# Patient Record
Sex: Male | Born: 1975 | Race: Black or African American | Hispanic: No | Marital: Married | Smoking: Current every day smoker
Health system: Southern US, Community
[De-identification: ages and names within clinical notes are randomized; demographics above are authoritative.]

## PROBLEM LIST (undated history)

## (undated) DIAGNOSIS — G8929 Other chronic pain: Secondary | ICD-10-CM

## (undated) DIAGNOSIS — M549 Dorsalgia, unspecified: Secondary | ICD-10-CM

## (undated) HISTORY — PX: APPENDECTOMY: SHX54

---

## 1999-12-24 ENCOUNTER — Encounter: Payer: Self-pay | Admitting: Emergency Medicine

## 1999-12-25 ENCOUNTER — Inpatient Hospital Stay (HOSPITAL_COMMUNITY): Admission: EM | Admit: 1999-12-25 | Discharge: 1999-12-26 | Payer: Self-pay | Admitting: Emergency Medicine

## 2003-01-27 ENCOUNTER — Emergency Department (HOSPITAL_COMMUNITY): Admission: EM | Admit: 2003-01-27 | Discharge: 2003-01-27 | Payer: Self-pay | Admitting: Emergency Medicine

## 2006-05-14 ENCOUNTER — Emergency Department (HOSPITAL_COMMUNITY): Admission: EM | Admit: 2006-05-14 | Discharge: 2006-05-14 | Payer: Self-pay | Admitting: Emergency Medicine

## 2011-02-03 ENCOUNTER — Other Ambulatory Visit: Payer: Self-pay | Admitting: Family Medicine

## 2011-02-03 ENCOUNTER — Ambulatory Visit (HOSPITAL_COMMUNITY)
Admission: RE | Admit: 2011-02-03 | Discharge: 2011-02-03 | Disposition: A | Discharge status: Home / Self Care | Payer: BC Managed Care – PPO | Source: Ambulatory Visit | Attending: Family Medicine | Admitting: Family Medicine

## 2011-02-03 DIAGNOSIS — N509 Disorder of male genital organs, unspecified: Secondary | ICD-10-CM | POA: Insufficient documentation

## 2011-02-03 DIAGNOSIS — N5089 Other specified disorders of the male genital organs: Secondary | ICD-10-CM

## 2011-02-03 DIAGNOSIS — N433 Hydrocele, unspecified: Secondary | ICD-10-CM | POA: Insufficient documentation

## 2011-11-23 ENCOUNTER — Ambulatory Visit: Payer: BC Managed Care – PPO

## 2011-11-23 ENCOUNTER — Ambulatory Visit (INDEPENDENT_AMBULATORY_CARE_PROVIDER_SITE_OTHER): Payer: BC Managed Care – PPO | Admitting: Family Medicine

## 2011-11-23 VITALS — BP 123/82 | HR 62 | Temp 98.6°F | Resp 16 | Ht 73.5 in | Wt 173.4 lb

## 2011-11-23 DIAGNOSIS — M79644 Pain in right finger(s): Secondary | ICD-10-CM

## 2011-11-23 DIAGNOSIS — S61409A Unspecified open wound of unspecified hand, initial encounter: Secondary | ICD-10-CM

## 2011-11-23 DIAGNOSIS — M79609 Pain in unspecified limb: Secondary | ICD-10-CM

## 2011-11-23 MED ORDER — CEPHALEXIN 250 MG PO CAPS: 250.0000 mg | ORAL_CAPSULE | Freq: Two times a day (BID) | ORAL | Status: AC

## 2011-11-23 NOTE — Progress Notes (Signed)
Procedure:  VCO  Lidocaine 2% plain, MCH block Sterile field and prep. Wound explored.  Adipose tissue visualized. Wound irrigated with 15 cc sterile NACL Closed loosely with # 3 HM, 5.0 Ethilon Cleansed and dressed.

## 2011-11-23 NOTE — Patient Instructions (Signed)

## 2011-11-23 NOTE — Progress Notes (Signed)
Patient ID: Evan Franklin, male   DOB: 1976-03-31, 36 y.o.   MRN: [id number] Evan Franklin is a 36 y.o. male who presents to Urgent Care today for crush injury to Right 5th digit:  1.  Crush injury:  Patient accidentally dropped large metal crate on 5th digit of Right hand.  Had immediate pain and swelling.  Also some bleeding at that time as well where he suffered laceration at the end of his finger.  Waited about 23 hours before he presented to care.  Pain increased and he had some further bleeding as well as some minor tissue extruding from laceration.     PMH reviewed.    ROS as above otherwise neg Medications reviewed. No current outpatient prescriptions on file.    Exam:  BP 123/82  Pulse 62  Temp 98.6 F (37 C) (Oral)  Resp 16  Ht 6' 1.5" (1.867 m)  Wt 173 lb 6.4 oz (78.654 kg)  BMI 22.57 kg/m2  SpO2 98% Gen: Well NAD HEENT: EOMI,  MMM Ext:  Right 5th digit with 1.5 cm laceration noted at tip of finger.  Full extension and flexion of finger.  Moderate tenderness to palpation.   CV:  Good cap refill in this and other 9 digits.     [facility] reading (PRIMARY) by  Dr. Gwendolyn Grant:  No distal tuft fracture, no displacement.  No cortical fractures. .   Assessment and Plan:  1.  Finger laceration:  No cortical injury/fracture to distal phalanx.  Sutured repair performed by PA.  Only 2 sutures due to delay.  FU in 48 hours for wound recheck and 7 days for suture removal.

## 2012-01-13 IMAGING — US US SCROTUM
1 series · 14 of 25 positions shown · non-contrast
Comparison: None

CLINICAL DATA: Right testicular swelling and pain

SCROTAL ULTRASOUND
DOPPLER ULTRASOUND OF THE TESTICLES
TECHNIQUE: Complete ultrasound examination of the testicles,
epididymis, and other scrotal structures was performed.  Color and
spectral Doppler ultrasound were also utilized to evaluate blood
flow to the testicles.

[Series 1: us scrotum · 0.08mm/px · 14 of 48 slices shown]
[im 1/48]
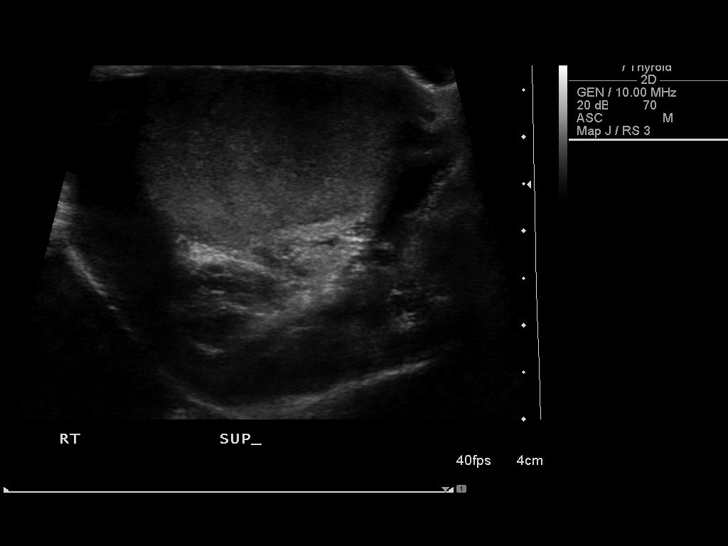
[im 4/48]
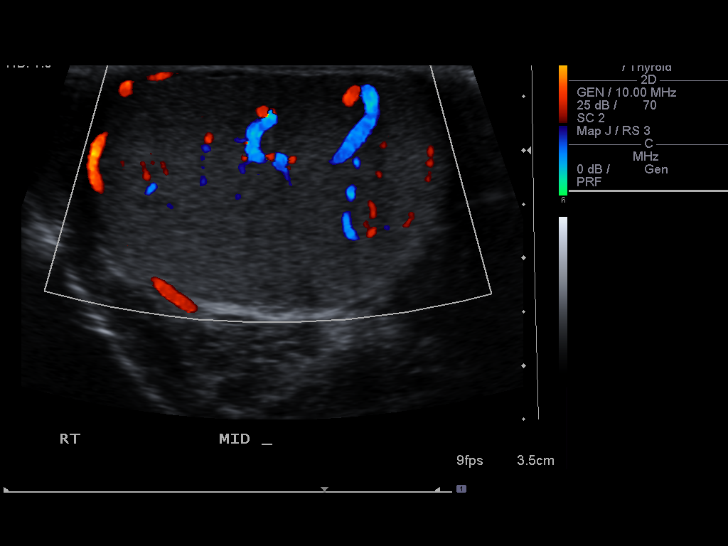
[im 8/48]
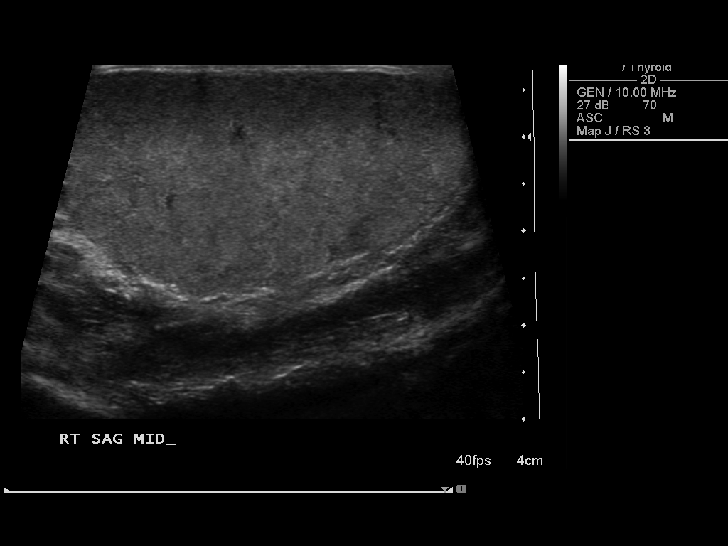
[im 12/48]
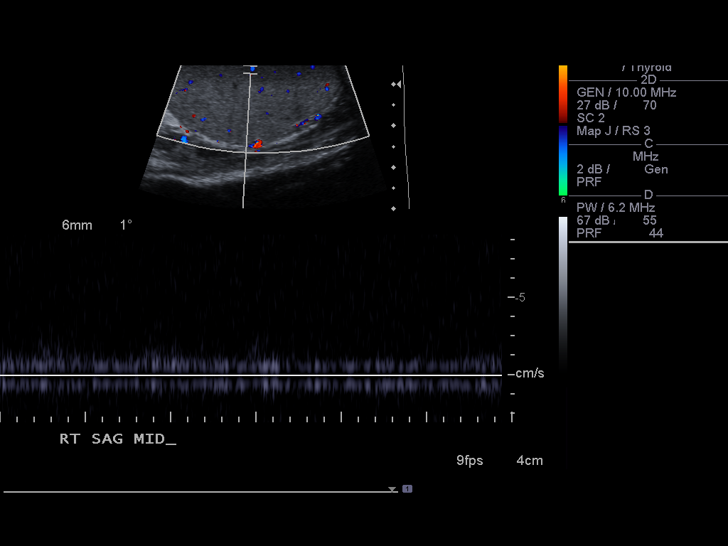
[im 16/48]
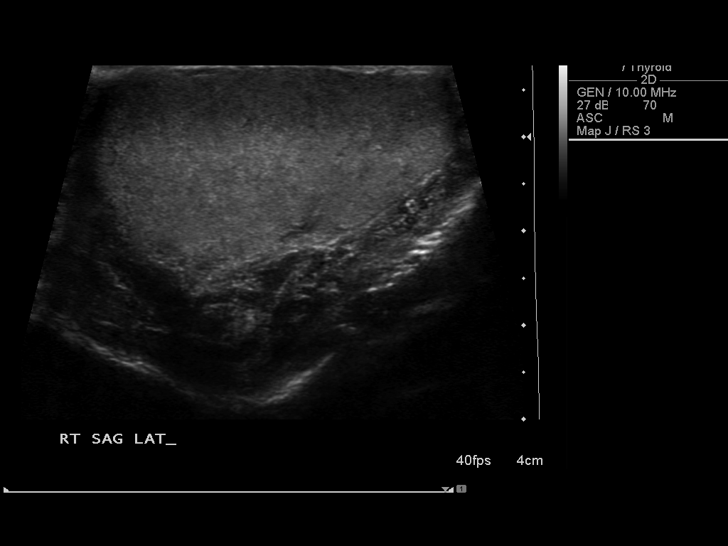
[im 18/48]
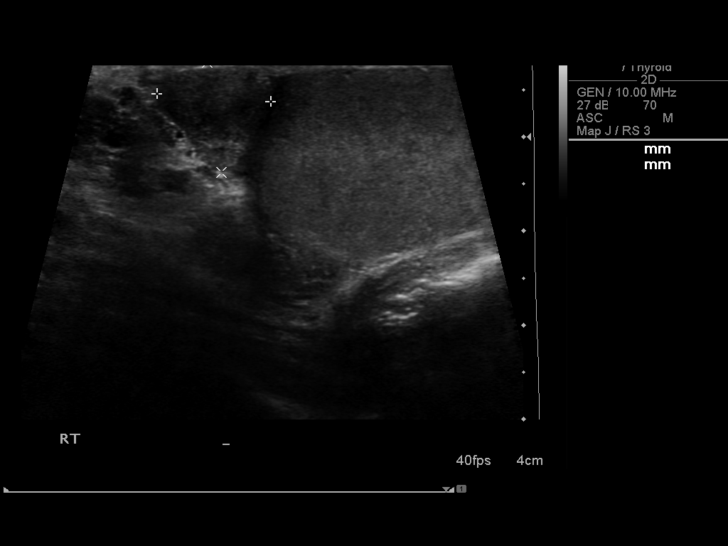
[im 22/48]
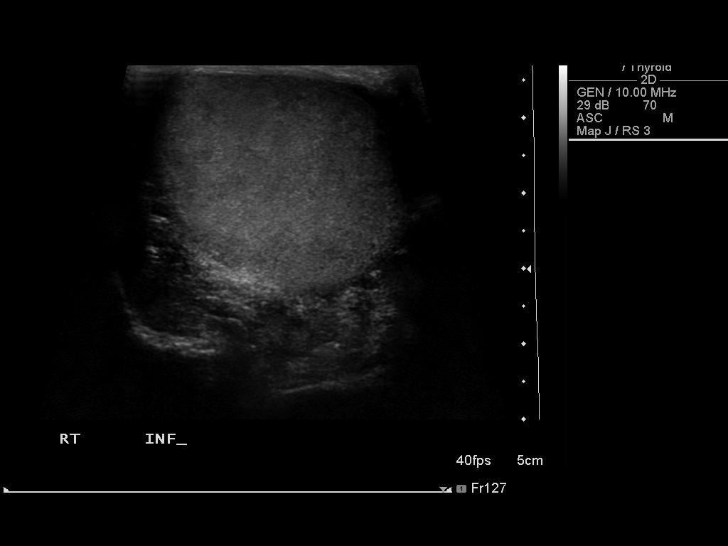
[im 26/48]
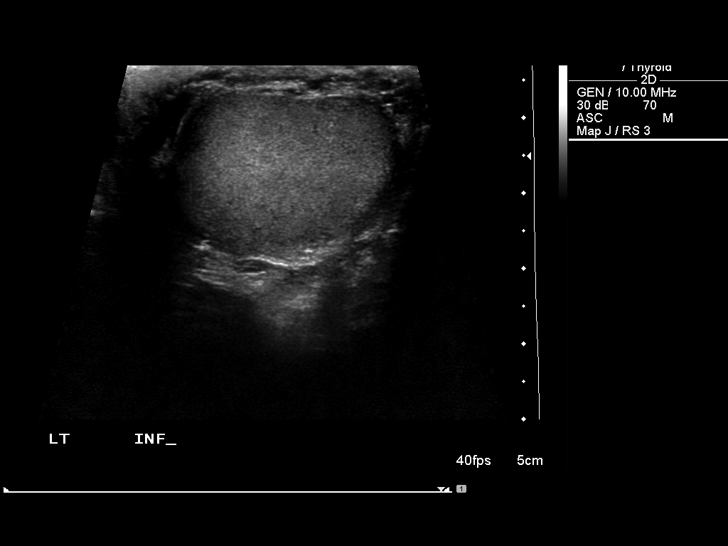
[im 30/48]
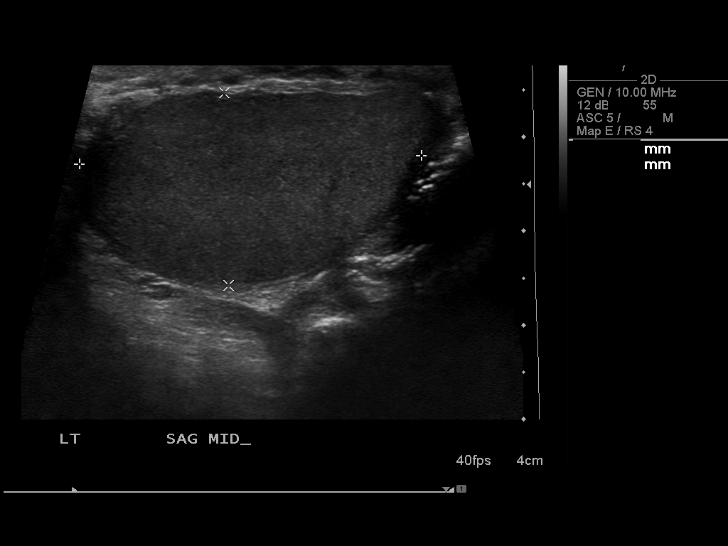
[im 32/48]
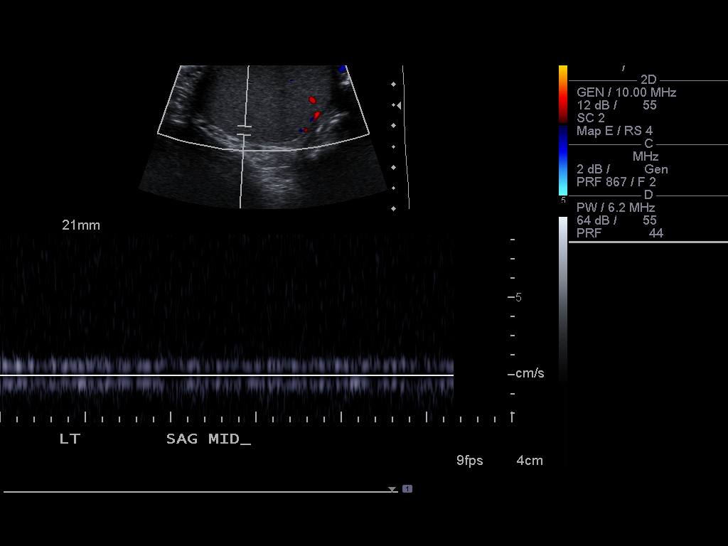
[im 36/48]
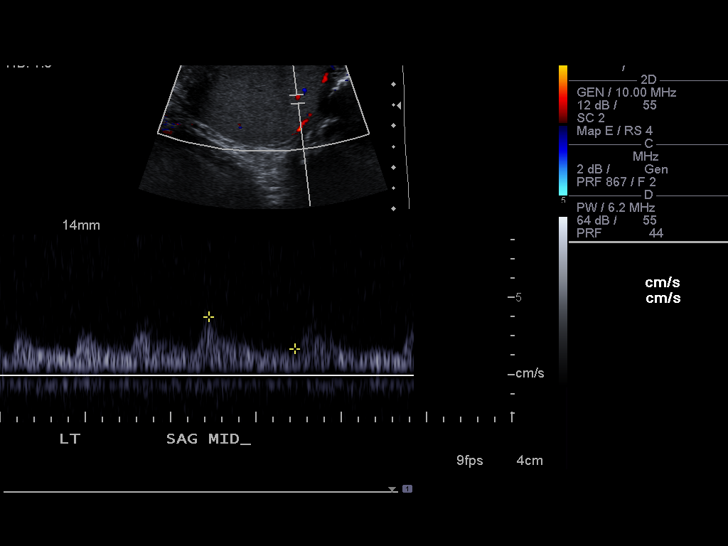
[im 40/48]
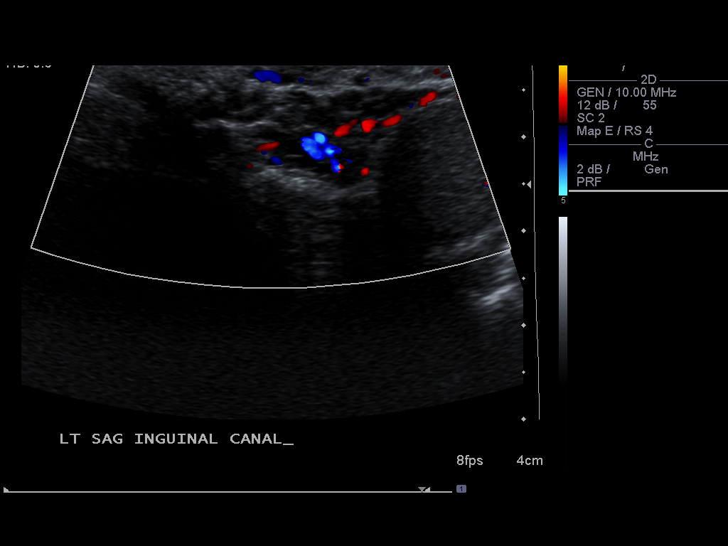
[im 44/48]
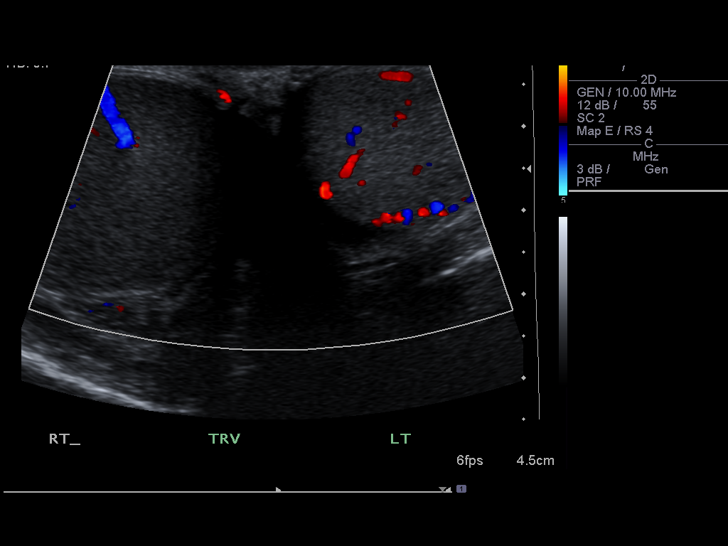
[im 48/48]
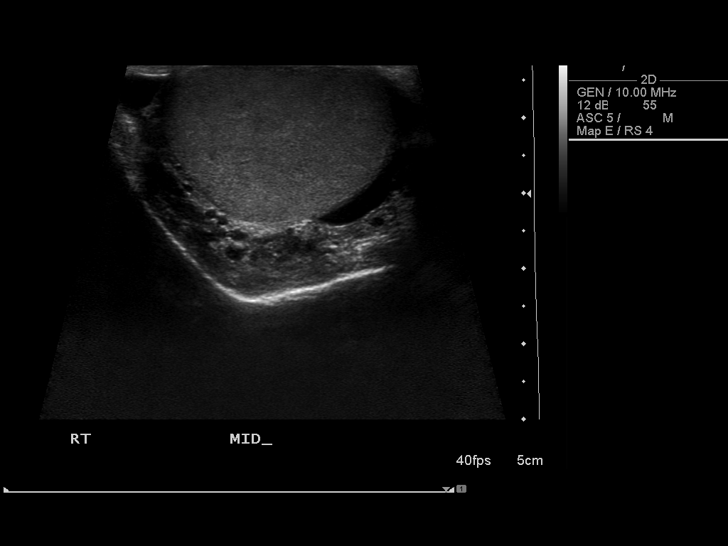

[14 of 25 positions shown; findings below may reference images not displayed]

FINDINGS: Right testis:  24 x 33 x 46 mm.  No focal lesion.  Normal color
Doppler signal.  Arterial and venous waveforms are recorded.

Left testis:  21 x 31 x 36 mm, normal in echotexture without focal
lesion.  Normal color Doppler signal.  Arterial and venous wave
forms recorded.

Right epididymis:  Prominent head measuring 12 x 12 mm.  No
hyperemia.

Left epididymis:  Normal in size and appearance.

Hydocele:  Small, bilateral.

Varicocele:  None seen

Pulsed Doppler interrogation of both testes demonstrates low
resistance flow bilaterally.
Right scrotal skin thickening is incidentally noted.
IMPRESSION: 1.  Normal testes.
2.  Mildly prominent right epididymal head but no hyperemia to
strongly suggest epididymitis.
3.  Small bilateral hydroceles.

## 2012-01-31 ENCOUNTER — Emergency Department (HOSPITAL_COMMUNITY)
Admission: EM | Admit: 2012-01-31 | Discharge: 2012-01-31 | Disposition: A | Discharge status: Home / Self Care | Payer: BC Managed Care – PPO | Attending: Emergency Medicine | Admitting: Emergency Medicine

## 2012-01-31 ENCOUNTER — Encounter (HOSPITAL_COMMUNITY): Payer: Self-pay | Admitting: *Deleted

## 2012-01-31 DIAGNOSIS — F17200 Nicotine dependence, unspecified, uncomplicated: Secondary | ICD-10-CM | POA: Insufficient documentation

## 2012-01-31 DIAGNOSIS — M545 Low back pain, unspecified: Secondary | ICD-10-CM | POA: Insufficient documentation

## 2012-01-31 DIAGNOSIS — G8929 Other chronic pain: Secondary | ICD-10-CM | POA: Insufficient documentation

## 2012-01-31 HISTORY — DX: Other chronic pain: G89.29

## 2012-01-31 HISTORY — DX: Dorsalgia, unspecified: M54.9

## 2012-01-31 MED ORDER — METHOCARBAMOL 500 MG PO TABS: 1000.0000 mg | ORAL_TABLET | Freq: Four times a day (QID) | ORAL | Status: AC | PRN

## 2012-01-31 MED ORDER — HYDROCODONE-ACETAMINOPHEN 5-325 MG PO TABS: ORAL_TABLET | ORAL | Status: AC

## 2012-01-31 MED ORDER — OXYCODONE-ACETAMINOPHEN 5-325 MG PO TABS
2.0000 | ORAL_TABLET | Freq: Once | ORAL | Status: AC
  Administered 2012-01-31: 2 via ORAL
  Filled 2012-01-31: qty 2

## 2012-01-31 MED ORDER — NAPROXEN 250 MG PO TABS: 250.0000 mg | ORAL_TABLET | Freq: Two times a day (BID) | ORAL | Status: AC

## 2012-01-31 MED ORDER — IBUPROFEN 200 MG PO TABS
400.0000 mg | ORAL_TABLET | Freq: Once | ORAL | Status: AC
  Administered 2012-01-31: 400 mg via ORAL
  Filled 2012-01-31: qty 2

## 2012-01-31 NOTE — ED Provider Notes (Signed)
History     CSN: [id number]  Arrival date & time 01/31/12  0806   First MD Initiated Contact with Patient 01/31/12 [id number]      Chief Complaint  Patient presents with  . Back Pain    HPI Pt was seen at 0825.  Per pt, c/o gradual onset and persistence of constant acute flair of his chronic low back "pain" for the past several days.  Denies any change in his usual chronic pain pattern.  Pain worsens with palpation of the area and body position changes. Denies incont/retention of bowel or bladder, no saddle anesthesia, no focal motor weakness, no tingling/numbness in extremities, no fevers, no injury, no abd pain.   The symptoms have been associated with no other complaints. The patient has a significant history of similar symptoms previously.    Past Medical History  Diagnosis Date  . Chronic back pain     Past Surgical History  Procedure Date  . Appendectomy     History  Substance Use Topics  . Smoking status: Current Everyday Smoker -- 1.0 packs/day for 15 years    Types: Cigarettes  . Smokeless tobacco: Never Used  . Alcohol Use: Yes     Review of Systems ROS: Statement: All systems negative except as marked or noted in the HPI; Constitutional: Negative for fever and chills. ; ; Eyes: Negative for eye pain, redness and discharge. ; ; ENMT: Negative for ear pain, hoarseness, nasal congestion, sinus pressure and sore throat. ; ; Cardiovascular: Negative for chest pain, palpitations, diaphoresis, dyspnea and peripheral edema. ; ; Respiratory: Negative for cough, wheezing and stridor. ; ; Gastrointestinal: Negative for nausea, vomiting, diarrhea, abdominal pain, blood in stool, hematemesis, jaundice and rectal bleeding. . ; ; Genitourinary: Negative for dysuria, flank pain and hematuria. ; ; Musculoskeletal: +LBP. Negative for neck pain. Negative for swelling and trauma.; ; Skin: Negative for pruritus, rash, abrasions, blisters, bruising and skin lesion.; ; Neuro: Negative for  headache, lightheadedness and neck stiffness. Negative for weakness, altered level of consciousness , altered mental status, extremity weakness, paresthesias, involuntary movement, seizure and syncope.       Allergies  Review of patient's allergies indicates no known allergies.  Home Medications   Current Outpatient Rx  Name Route Sig Dispense Refill  . GOODY HEADACHE PO Oral Take 1 packet by mouth every 8 (eight) hours as needed. pain      BP 135/90  Pulse 50  Temp 98 F (36.7 C) (Oral)  Resp 18  Ht 6\' 1"  (1.854 m)  Wt 180 lb (81.647 kg)  BMI 23.75 kg/m2  SpO2 100%  Physical Exam 0825: Physical examination:  Nursing notes reviewed; Vital signs and O2 SAT reviewed;  Constitutional: Well developed, Well nourished, Well hydrated, In no acute distress; Head:  Normocephalic, atraumatic; Eyes: EOMI, PERRL, No scleral icterus; ENMT: Mouth and pharynx normal, Mucous membranes moist; Neck: Supple, Full range of motion, No lymphadenopathy; Cardiovascular: Regular rate and rhythm, No murmur, rub, or gallop; Respiratory: Breath sounds clear & equal bilaterally, No rales, rhonchi, wheezes.  Speaking full sentences with ease, Normal respiratory effort/excursion; Chest: Nontender, Movement normal; Abdomen: Soft, Nontender, Nondistended, Normal bowel sounds; Genitourinary: No CVA tenderness; Spine:  No midline CS, TS, LS tenderness.  +TTP left lumbar paraspinal muscles. No rash, no ecchymosis, no open wounds;; Extremities: Pulses normal, No tenderness, No edema, No calf edema or asymmetry.; Neuro: AA&Ox3, Major CN grossly intact.  Speech clear. Gait steady. No gross focal motor or sensory deficits in extremities.; Skin:  Color normal, Warm, Dry.    ED Course  Procedures   MDM  MDM Reviewed: previous chart, nursing note and vitals      0830:  Long hx of chronic pain, endorses acute flair of his usual long standing chronic pain today, no change from his usual chronic pain pattern.  Pt  encouraged to f/u with his PMD at [facility] for good continuity of care and control of his chronic pain.  Verb understanding.        Laray Anger, DO 02/03/12 1250

## 2012-01-31 NOTE — ED Notes (Signed)
Chronic back pain, yesterday symptoms become worse, able to ambulate with difficulty

## 2012-11-01 IMAGING — CR DG FINGER LITTLE 2+V*R*
1 series · 1 of 1 positions shown · non-contrast
Comparison: None.

CLINICAL DATA: 36-year-old male with crush injury, finger pain.

RIGHT LITTLE FINGER 2+V

[PA]
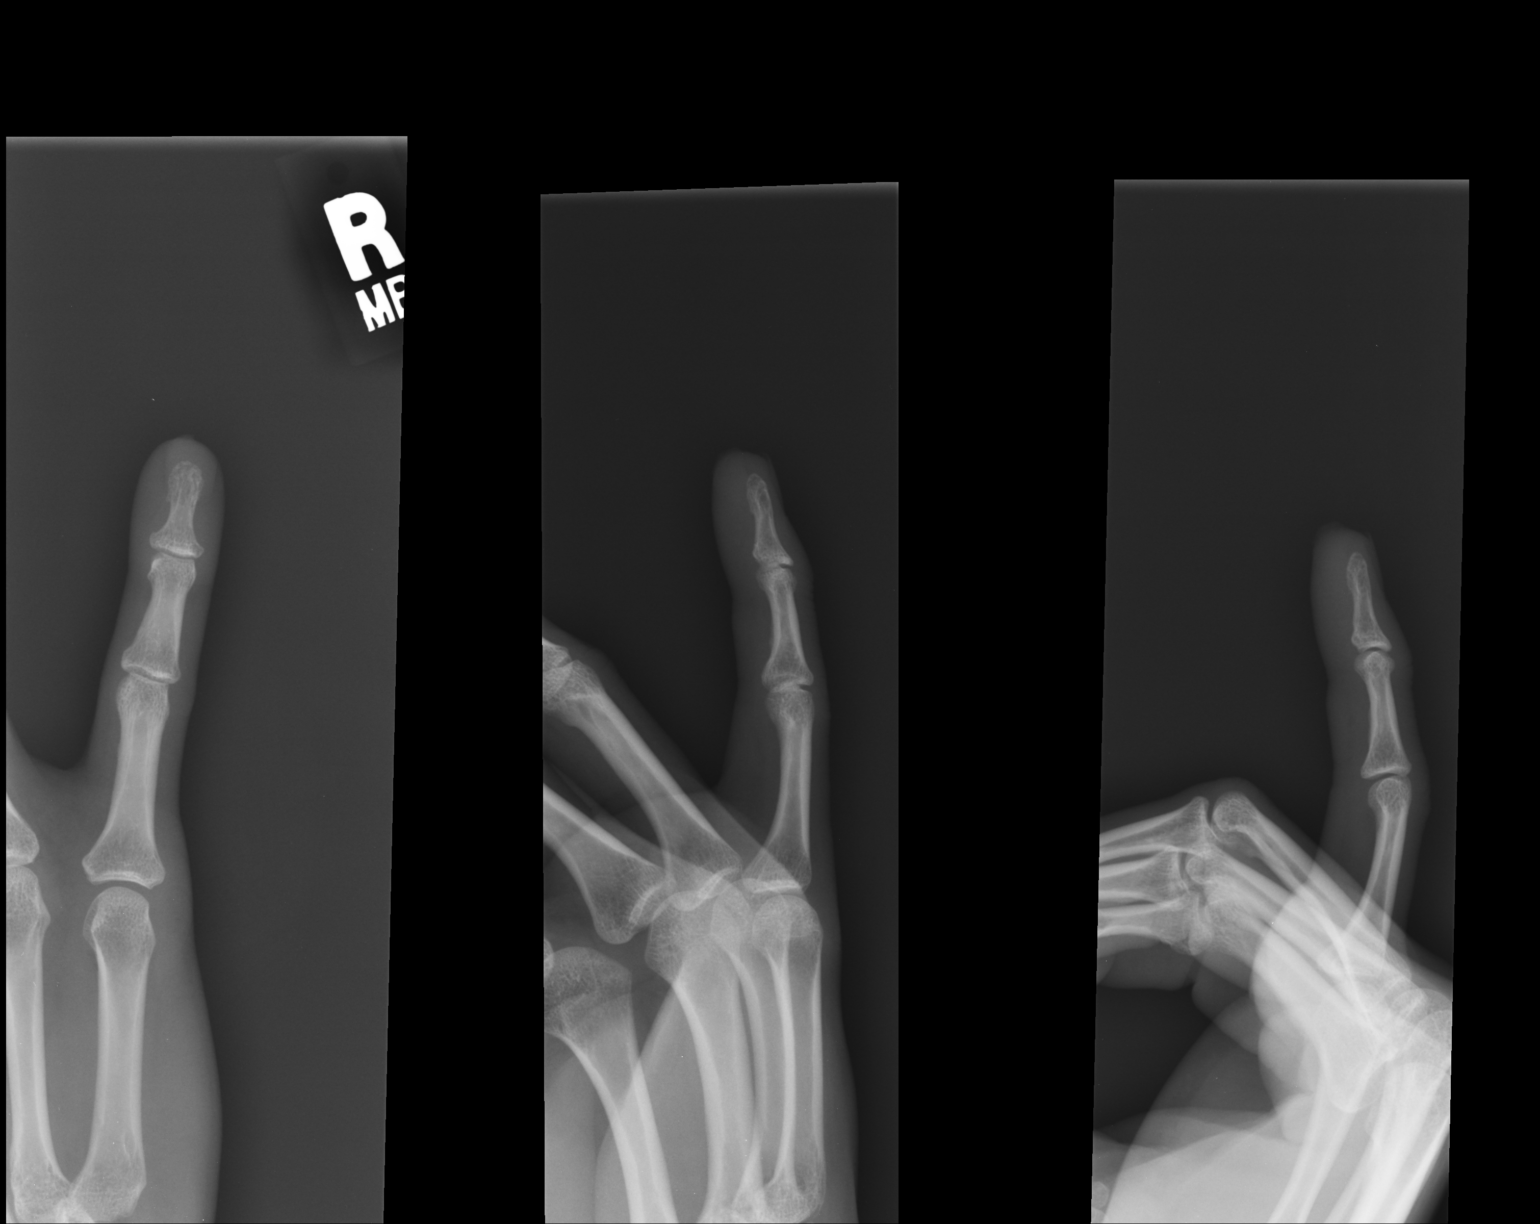

[1 of 1 positions shown; findings below may reference images not displayed]

FINDINGS: Soft tissue swelling and irregularity at the distal right
fifth finger.  No definite fracture of the tuft, and otherwise the
distal phalanx is intact.  Joint spaces are preserved.  More
proximal phalanges and the fifth metacarpal are intact.
IMPRESSION: Soft tissue injury without definite underlying fracture at the
distal right fifth finger.

Clinically significant discrepancy from primary report, if
provided: None

## 2014-11-24 ENCOUNTER — Emergency Department (HOSPITAL_COMMUNITY)
Admission: EM | Admit: 2014-11-24 | Discharge: 2014-11-24 | Disposition: A | Discharge status: Home / Self Care | Payer: BLUE CROSS/BLUE SHIELD | Attending: Emergency Medicine | Admitting: Emergency Medicine

## 2014-11-24 ENCOUNTER — Encounter (HOSPITAL_COMMUNITY): Payer: Self-pay | Admitting: Emergency Medicine

## 2014-11-24 DIAGNOSIS — Y92310 Basketball court as the place of occurrence of the external cause: Secondary | ICD-10-CM | POA: Diagnosis not present

## 2014-11-24 DIAGNOSIS — W228XXA Striking against or struck by other objects, initial encounter: Secondary | ICD-10-CM | POA: Insufficient documentation

## 2014-11-24 DIAGNOSIS — S01112A Laceration without foreign body of left eyelid and periocular area, initial encounter: Secondary | ICD-10-CM

## 2014-11-24 DIAGNOSIS — G8929 Other chronic pain: Secondary | ICD-10-CM | POA: Diagnosis not present

## 2014-11-24 DIAGNOSIS — Y998 Other external cause status: Secondary | ICD-10-CM | POA: Insufficient documentation

## 2014-11-24 DIAGNOSIS — Y9367 Activity, basketball: Secondary | ICD-10-CM | POA: Diagnosis not present

## 2014-11-24 DIAGNOSIS — Z720 Tobacco use: Secondary | ICD-10-CM | POA: Insufficient documentation

## 2014-11-24 MED ORDER — LIDOCAINE-EPINEPHRINE 2 %-1:100000 IJ SOLN
20.0000 mL | Freq: Once | INTRAMUSCULAR | Status: AC
  Administered 2014-11-24: 20 mL
  Filled 2014-11-24: qty 1

## 2014-11-24 NOTE — ED Provider Notes (Signed)
CSN: [id number]     Arrival date & time 11/24/14  0008 History   First MD Initiated Contact with Patient 11/24/14 0033     Chief Complaint  Patient presents with  . Facial Laceration     (Consider location/radiation/quality/duration/timing/severity/associated sxs/prior Treatment) HPI  This is a 39 year old male who was playing basketball yesterday evening and hit a pole with his left eyebrow. He has a stellate laceration to the left eyebrow. There was no loss of consciousness. He has not been vomiting. He does admit to drinking alcohol. Bleeding has been controlled with pressure. He denies neck pain or other injury. Pain is mild. As tetanus update was about a year ago.  Past Medical History  Diagnosis Date  . Chronic back pain    Past Surgical History  Procedure Laterality Date  . Appendectomy     No family history on file. History  Substance Use Topics  . Smoking status: Current Every Day Smoker -- 1.00 packs/day for 15 years    Types: Cigarettes  . Smokeless tobacco: Never Used  . Alcohol Use: Yes    Review of Systems  All other systems reviewed and are negative.   Allergies  Review of patient's allergies indicates no known allergies.  Home Medications   Prior to Admission medications   Medication Sig Start Date End Date Taking? Authorizing Provider  Aspirin-Acetaminophen-Caffeine (GOODY HEADACHE PO) Take 1 packet by mouth every 8 (eight) hours as needed. pain    Historical Provider, MD   BP 112/74 mmHg  Pulse 82  Temp(Src) 97.6 F (36.4 C) (Oral)  Resp 18  Ht 6\' 1"  (1.854 m)  Wt 190 lb (86.183 kg)  BMI 25.07 kg/m2  SpO2 95%   Physical Exam  General: Well-developed, well-nourished male in no acute distress; appearance consistent with age of record HENT: normocephalic; stellate laceration to left eyebrow; no hemotympanum Eyes: pupils equal, round and reactive to light; extraocular muscles intact Neck: supple; nontender Heart: regular rate and rhythm Lungs:  clear to auscultation bilaterally Abdomen: soft; nondistended Extremities: No deformity; full range of motion; pulses normal Neurologic: Awake, alert and oriented; motor function intact in all extremities and symmetric; no facial droop Skin: Warm and dry Psychiatric: Normal mood and affect    ED Course  Procedures (including critical care time)  LACERATION REPAIR Performed by: Alfreida Steffenhagen L Authorized by: Hanley Seamen Consent: Verbal consent obtained. Risks and benefits: risks, benefits and alternatives were discussed Consent given by: patient Patient identity confirmed: provided demographic data Prepped and Draped in normal sterile fashion Wound explored  Laceration Location: Left eyebrow  Laceration Length: 4 cm, stellate  No Foreign Bodies seen or palpated  Anesthesia: local infiltration  Local anesthetic: lidocaine 2 % with epinephrine  Anesthetic total: 3 ml  Irrigation method: syringe Amount of cleaning: standard  Skin closure: 4-0 Prolene   Number of sutures: 8   Technique: Simple interrupted   Patient tolerance: Patient tolerated the procedure well with no immediate complications.   MDM     Paula Libra, MD 11/24/14 0120

## 2014-11-24 NOTE — ED Notes (Addendum)
Pt from home c/o laceration to the left brow occuring about 2 hours ago while playing basketball and running into a pole. Denies LOC however, visitor reports patient didn't remember talking to her. Bleeding controlled.  Denies blurred vision. ETOH on board. Pt is calm and cooperative.

## 2014-11-24 NOTE — Discharge Instructions (Signed)
Facial Laceration ° A facial laceration is a cut on the face. These injuries can be painful and cause bleeding. Lacerations usually heal quickly, but they need special care to reduce scarring. °DIAGNOSIS  °Your health care provider will take a medical history, ask for details about how the injury occurred, and examine the wound to determine how deep the cut is. °TREATMENT  °Some facial lacerations may not require closure. Others may not be able to be closed because of an increased risk of infection. The risk of infection and the chance for successful closure will depend on various factors, including the amount of time since the injury occurred. °The wound may be cleaned to help prevent infection. If closure is appropriate, pain medicines may be given if needed. Your health care provider will use stitches (sutures), wound glue (adhesive), or skin adhesive strips to repair the laceration. These tools bring the skin edges together to allow for faster healing and a better cosmetic outcome. If needed, you may also be given a tetanus shot. °HOME CARE INSTRUCTIONS °· Only take over-the-counter or prescription medicines as directed by your health care provider. °· Follow your health care provider's instructions for wound care. These instructions will vary depending on the technique used for closing the wound. °For Sutures: °· Keep the wound clean and dry.   °· If you were given a bandage (dressing), you should change it at least once a day. Also change the dressing if it becomes wet or dirty, or as directed by your health care provider.   °· Wash the wound with soap and water 2 times a day. Rinse the wound off with water to remove all soap. Pat the wound dry with a clean towel.   °· After cleaning, apply a thin layer of the antibiotic ointment recommended by your health care provider. This will help prevent infection and keep the dressing from sticking.   °· You may shower as usual after the first 24 hours. Do not soak the  wound in water until the sutures are removed.   °· Get your sutures removed as directed by your health care provider. With facial lacerations, sutures should usually be taken out after 4-5 days to avoid stitch marks.   °· Wait a few days after your sutures are removed before applying any makeup. ° °After Healing: °Once the wound has healed, cover the wound with sunscreen during the day for 1 full year. This can help minimize scarring. Exposure to ultraviolet light in the first year will darken the scar. It can take 1-2 years for the scar to lose its redness and to heal completely.  °SEEK IMMEDIATE MEDICAL CARE IF: °· You have redness, pain, or swelling around the wound.   °· You see a yellowish-white fluid (pus) coming from the wound.   °· You have chills or a fever.   °MAKE SURE YOU: °· Understand these instructions. °· Will watch your condition. °· Will get help right away if you are not doing well or get worse. °Document Released: 06/24/2004 Document Revised: 03/07/2013 Document Reviewed: 12/28/2012 °[employer]® Patient Information ©2015 [employer]. This information is not intended to replace advice given to you by your health care provider. Make sure you discuss any questions you have with your health care provider. ° °

## 2014-11-24 NOTE — ED Notes (Signed)
ABC's intact, NAD

## 2014-11-24 NOTE — ED Notes (Signed)
MD at bedside. 

## 2014-11-24 NOTE — Progress Notes (Addendum)
Noted Laceration to eye brow area cleansed, . Area sutured by provider. Patient tolerated procedure without difficulty.

## 2014-11-30 ENCOUNTER — Emergency Department (HOSPITAL_COMMUNITY)
Admission: EM | Admit: 2014-11-30 | Discharge: 2014-11-30 | Disposition: A | Discharge status: Home / Self Care | Payer: BLUE CROSS/BLUE SHIELD | Attending: Emergency Medicine | Admitting: Emergency Medicine

## 2014-11-30 ENCOUNTER — Encounter (HOSPITAL_COMMUNITY): Payer: Self-pay | Admitting: Emergency Medicine

## 2014-11-30 DIAGNOSIS — G8929 Other chronic pain: Secondary | ICD-10-CM | POA: Diagnosis not present

## 2014-11-30 DIAGNOSIS — Z720 Tobacco use: Secondary | ICD-10-CM | POA: Diagnosis not present

## 2014-11-30 DIAGNOSIS — Z4802 Encounter for removal of sutures: Secondary | ICD-10-CM

## 2014-11-30 NOTE — ED Notes (Signed)
Presents for suture removal with no other c/o. Pt is A&O and in NAD

## 2014-11-30 NOTE — Discharge Instructions (Signed)

## 2014-11-30 NOTE — ED Provider Notes (Signed)
CSN: [id number]     Arrival date & time 11/30/14  1057 History   First MD Initiated Contact with Patient 11/30/14 1100     Chief Complaint  Patient presents with  . Suture / Staple Removal     (Consider location/radiation/quality/duration/timing/severity/associated sxs/prior Treatment) HPI Comments: Pt comes in with need to have sutures removal. He had the sutures placed 6 days ago. Denies drainage or redness to the area. Tetanus is utd  The history is provided by the patient. No language interpreter was used.    Past Medical History  Diagnosis Date  . Chronic back pain    Past Surgical History  Procedure Laterality Date  . Appendectomy     No family history on file. History  Substance Use Topics  . Smoking status: Current Every Day Smoker -- 1.00 packs/day for 15 years    Types: Cigarettes  . Smokeless tobacco: Never Used  . Alcohol Use: Yes    Review of Systems  Respiratory: Negative.   Cardiovascular: Negative.       Allergies  Review of patient's allergies indicates no known allergies.  Home Medications   Prior to Admission medications   Medication Sig Start Date End Date Taking? Authorizing Provider  Aspirin-Acetaminophen-Caffeine (GOODY HEADACHE PO) Take 1 packet by mouth every 8 (eight) hours as needed. pain    Historical Provider, MD   BP 137/90 mmHg  Pulse 55  Temp(Src) 98.3 F (36.8 C) (Oral)  Resp 16  SpO2 100% Physical Exam  Cardiovascular: Normal rate and regular rhythm.   Pulmonary/Chest: Effort normal and breath sounds normal.  Skin:  Well healing wound to the right eyebrow  Nursing note and vitals reviewed.   ED Course  SUTURE REMOVAL Date/Time: 11/30/2014 11:20 AM Performed by: Teressa LowerPICKERING, Cle[place]ne Savini Authorized by: Teressa LowerPICKERING, Iva Montelongo Consent: Verbal consent obtained. Consent given by: patient Patient identity confirmed: verbally with patient Body area: head/neck Location details: left eyebrow Wound Appearance: clean Sutures Removed:  8 Facility: sutures placed in this facility Patient tolerance: Patient tolerated the procedure well with no immediate complications   (including critical care time) Labs Review Labs Reviewed - No data to display  Imaging Review No results found.   EKG Interpretation None      MDM   Final diagnoses:  Visit for suture removal   Suture removed without any problem    Teressa LowerVrinda Jezreel Justiniano, NP 11/30/14 1121  Donnetta HutchingBrian Cook, MD 11/30/14 [phone]

## 2015-08-20 ENCOUNTER — Telehealth: Payer: Self-pay

## 2015-08-20 NOTE — Telephone Encounter (Signed)
RTC

## 2015-08-20 NOTE — Telephone Encounter (Signed)
Patient states that he has had the flu for the past 5 days, and he is still experiencing chills, sinus and throat issues.  He is asking for recommendations for what to take.  He said he already took [employer], Dayquil, Nyquil, Ibuprofen, cough drops and none of those are helping.  I told him that he hasn't been here in over three years, so he may need to come in to be evaluated, as he would be considered a new patient.  Please advise.  Cb#: [phone]

## 2018-03-24 ENCOUNTER — Institutional Professional Consult (permissible substitution): Payer: BLUE CROSS/BLUE SHIELD | Admitting: Pulmonary Disease

## 2018-03-28 ENCOUNTER — Institutional Professional Consult (permissible substitution): Payer: BLUE CROSS/BLUE SHIELD | Admitting: Pulmonary Disease

## 2018-04-18 ENCOUNTER — Institutional Professional Consult (permissible substitution): Payer: Self-pay | Admitting: Pulmonary Disease

## 2022-07-01 ENCOUNTER — Other Ambulatory Visit: Payer: Self-pay | Admitting: Physician Assistant

## 2022-07-01 DIAGNOSIS — M545 Low back pain, unspecified: Secondary | ICD-10-CM
